# Patient Record
Sex: Male | Born: 1980 | Race: White | Hispanic: No | Smoking: Current every day smoker
Health system: Southern US, Community
[De-identification: ages and names within clinical notes are randomized; demographics above are authoritative.]

---

## 2013-10-29 ENCOUNTER — Emergency Department (HOSPITAL_COMMUNITY)
Admission: EM | Admit: 2013-10-29 | Discharge: 2013-10-29 | Disposition: A | Discharge status: Home / Self Care | Payer: Self-pay | Attending: Emergency Medicine | Admitting: Emergency Medicine

## 2013-10-29 ENCOUNTER — Encounter (HOSPITAL_COMMUNITY): Payer: Self-pay | Admitting: Emergency Medicine

## 2013-10-29 DIAGNOSIS — F17200 Nicotine dependence, unspecified, uncomplicated: Secondary | ICD-10-CM | POA: Insufficient documentation

## 2013-10-29 DIAGNOSIS — X500XXA Overexertion from strenuous movement or load, initial encounter: Secondary | ICD-10-CM | POA: Insufficient documentation

## 2013-10-29 DIAGNOSIS — Y9389 Activity, other specified: Secondary | ICD-10-CM | POA: Insufficient documentation

## 2013-10-29 DIAGNOSIS — S335XXA Sprain of ligaments of lumbar spine, initial encounter: Secondary | ICD-10-CM | POA: Insufficient documentation

## 2013-10-29 DIAGNOSIS — Y929 Unspecified place or not applicable: Secondary | ICD-10-CM | POA: Insufficient documentation

## 2013-10-29 DIAGNOSIS — S39012A Strain of muscle, fascia and tendon of lower back, initial encounter: Secondary | ICD-10-CM

## 2013-10-29 MED ORDER — CYCLOBENZAPRINE HCL 10 MG PO TABS
10.0000 mg | ORAL_TABLET | Freq: Once | ORAL | Status: AC
  Administered 2013-10-29: 10 mg via ORAL
  Filled 2013-10-29: qty 1

## 2013-10-29 MED ORDER — CYCLOBENZAPRINE HCL 10 MG PO TABS: 10.0000 mg | ORAL_TABLET | Freq: Three times a day (TID) | ORAL | Status: DC | PRN

## 2013-10-29 MED ORDER — OXYCODONE-ACETAMINOPHEN 5-325 MG PO TABS: 1.0000 | ORAL_TABLET | ORAL | Status: DC | PRN

## 2013-10-29 MED ORDER — IBUPROFEN 800 MG PO TABS: 800.0000 mg | ORAL_TABLET | Freq: Three times a day (TID) | ORAL | Status: DC

## 2013-10-29 MED ORDER — OXYCODONE-ACETAMINOPHEN 5-325 MG PO TABS
2.0000 | ORAL_TABLET | Freq: Once | ORAL | Status: AC
  Administered 2013-10-29: 2 via ORAL
  Filled 2013-10-29: qty 2

## 2013-10-29 NOTE — ED Provider Notes (Signed)
CSN: [id number]     Arrival date & time 10/29/13  1926 History   First MD Initiated Contact with Patient 10/29/13 2102     Chief Complaint  Patient presents with  . Back Pain     (Consider location/radiation/quality/duration/timing/severity/associated sxs/prior Treatment) Patient is a 33 y.o. male presenting with back pain. The history is provided by the patient.  Back Pain Location:  Lumbar spine and thoracic spine Quality:  Aching and burning Pain severity:  Moderate Pain is:  Same all the time Onset quality:  Gradual Duration:  3 days Timing:  Constant Progression:  Unchanged Chronicity:  New Context: lifting heavy objects and twisting   Context: not falling   Context comment:  Pt reports pain after moving furnitiure Relieved by:  Nothing Worsened by:  Bending, twisting and movement Ineffective treatments:  OTC medications and NSAIDs Associated symptoms: no abdominal pain, no abdominal swelling, no bladder incontinence, no bowel incontinence, no chest pain, no dysuria, no fever, no headaches, no leg pain, no numbness, no paresthesias, no pelvic pain, no perianal numbness, no tingling and no weakness     History reviewed. No pertinent past medical history. History reviewed. No pertinent past surgical history. History reviewed. No pertinent family history. History  Substance Use Topics  . Smoking status: Current Every Day Smoker  . Smokeless tobacco: Not on file  . Alcohol Use: Yes    Review of Systems  Constitutional: Negative for fever.  Respiratory: Negative for shortness of breath.   Cardiovascular: Negative for chest pain.  Gastrointestinal: Negative for vomiting, abdominal pain, constipation and bowel incontinence.  Genitourinary: Negative for bladder incontinence, dysuria, hematuria, flank pain, decreased urine volume, difficulty urinating and pelvic pain.       No perineal numbness or incontinence of urine or feces  Musculoskeletal: Positive for back pain.  Negative for joint swelling.  Skin: Negative for rash.  Neurological: Negative for tingling, weakness, numbness, headaches and paresthesias.  All other systems reviewed and are negative.     Allergies  Review of patient's allergies indicates no known allergies.  Home Medications   Prior to Admission medications   Not on File   BP 150/77  Pulse 82  Temp(Src) 98.1 F (36.7 C) (Oral)  Resp 20  Ht 5\' 11"  (1.803 m)  Wt 200 lb (90.719 kg)  BMI 27.91 kg/m2  SpO2 99% Physical Exam  Nursing note and vitals reviewed. Constitutional: He is oriented to person, place, and time. He appears well-developed and well-nourished. No distress.  HENT:  Head: Normocephalic and atraumatic.  Neck: Normal range of motion. Neck supple.  Cardiovascular: Normal rate, regular rhythm, normal heart sounds and intact distal pulses.   No murmur heard. Pulmonary/Chest: Effort normal and breath sounds normal. No respiratory distress.  Abdominal: Soft. He exhibits no distension. There is no tenderness.  Musculoskeletal: He exhibits tenderness. He exhibits no edema.       Lumbar back: He exhibits tenderness and pain. He exhibits normal range of motion, no swelling, no deformity, no laceration and normal pulse.       Arms: Localized ttp of the right thoracic and lumbar paraspinal muscles.  No spinal tenderness.  DP pulses are brisk and symmetrical.  Distal sensation intact.  Hip Flexors/Extensors are intact.  Pt has 5/5 strength against resistance of bilateral lower extremities.     Neurological: He is alert and oriented to person, place, and time. He has normal strength. No sensory deficit. He exhibits normal muscle tone. Coordination and gait normal.  Reflex Scores:  Patellar reflexes are 2+ on the right side and 2+ on the left side.      Achilles reflexes are 2+ on the right side and 2+ on the left side. Skin: Skin is warm and dry. No rash noted.    ED Course  Procedures (including critical care  time) Labs Review Labs Reviewed - No data to display  Imaging Review No results found.   EKG Interpretation None      MDM   Final diagnoses:  Lumbar strain   Pt has localized ttp of the paraspinal muscles on the right.  No indication for imaging at this time.  No focal neuro deficits, ambulates with steady gait.  No concerning neurological sx's.  Likely muscular strain.  Pt agrees to symptomatic tx with flexeril, ibuprofen and percocet.  Agrees to PMD f/u.  Pt is well appearing , VSS and stable for d/c     Janille Draughon L. Trisha Mangleriplett, PA-C 10/31/13 2208

## 2013-10-29 NOTE — Discharge Instructions (Signed)
Muscle Strain A muscle strain is an injury that occurs when a muscle is stretched beyond its normal length. Usually a small number of muscle fibers are torn when this happens. Muscle strain is rated in degrees. First-degree strains have the least amount of muscle fiber tearing and pain. Second-degree and third-degree strains have increasingly more tearing and pain.  Usually, recovery from muscle strain takes 1 2 weeks. Complete healing takes 5 6 weeks.  CAUSES  Muscle strain happens when a sudden, violent force placed on a muscle stretches it too far. This may occur with lifting, sports, or a fall.  RISK FACTORS Muscle strain is especially common in athletes.  SIGNS AND SYMPTOMS At the site of the muscle strain, there may be:  Pain.  Bruising.  Swelling.  Difficulty using the muscle due to pain or lack of normal function. DIAGNOSIS  Your health care provider will perform a physical exam and ask about your medical history. TREATMENT  Often, the best treatment for a muscle strain is resting, icing, and applying cold compresses to the injured area.  HOME CARE INSTRUCTIONS   Use the PRICE method of treatment to promote muscle healing during the first 2 3 days after your injury. The PRICE method involves:  Protecting the muscle from being injured again.  Restricting your activity and resting the injured body part.  Icing your injury. To do this, put ice in a plastic bag. Place a towel between your skin and the bag. Then, apply the ice and leave it on from 15 20 minutes each hour. After the third day, switch to moist heat packs.  Apply compression to the injured area with a splint or elastic bandage. Be careful not to wrap it too tightly. This may interfere with blood circulation or increase swelling.  Elevate the injured body part above the level of your heart as often as you can.  Only take over-the-counter or prescription medicines for pain, discomfort, or fever as directed by your  health care provider.  Warming up prior to exercise helps to prevent future muscle strains. SEEK MEDICAL CARE IF:   You have increasing pain or swelling in the injured area.  You have numbness, tingling, or a significant loss of strength in the injured area. MAKE SURE YOU:   Understand these instructions.  Will watch your condition.  Will get help right away if you are not doing well or get worse. Document Released: 05/10/2005 Document Revised: 02/28/2013 Document Reviewed: 12/07/2012 [facility] Patient Information 2014 [place], [place].  Lumbosacral Strain Lumbosacral strain is a strain of any of the parts that make up your lumbosacral vertebrae. Your lumbosacral vertebrae are the bones that make up the lower third of your backbone. Your lumbosacral vertebrae are held together by muscles and tough, fibrous tissue (ligaments).  CAUSES  A sudden blow to your back can cause lumbosacral strain. Also, anything that causes an excessive stretch of the muscles in the low back can cause this strain. This is typically seen when people exert themselves strenuously, fall, lift heavy objects, bend, or crouch repeatedly. RISK FACTORS  Physically demanding work.  Participation in pushing or pulling sports or sports that require sudden twist of the back (tennis, golf, baseball).  Weight lifting.  Excessive lower back curvature.  Forward-tilted pelvis.  Weak back or abdominal muscles or both.  Tight hamstrings. SIGNS AND SYMPTOMS  Lumbosacral strain may cause pain in the area of your injury or pain that moves (radiates) down your leg.  DIAGNOSIS Your health care provider  can often diagnose lumbosacral strain through a physical exam. In some cases, you may need tests such as X-ray exams.  TREATMENT  Treatment for your lower back injury depends on many factors that your clinician will have to evaluate. However, most treatment will include the use of anti-inflammatory medicines. HOME CARE  INSTRUCTIONS   Avoid hard physical activities (tennis, racquetball, waterskiing) if you are not in proper physical condition for it. This may aggravate or create problems.  If you have a back problem, avoid sports requiring sudden body movements. Swimming and walking are generally safer activities.  Maintain good posture.  Maintain a healthy weight.  For acute conditions, you may put ice on the injured area.  Put ice in a plastic bag.  Place a towel between your skin and the bag.  Leave the ice on for 20 minutes, 2 3 times a day.  When the low back starts healing, stretching and strengthening exercises may be recommended. SEEK MEDICAL CARE IF:  Your back pain is getting worse.  You experience severe back pain not relieved with medicines. SEEK IMMEDIATE MEDICAL CARE IF:   You have numbness, tingling, weakness, or problems with the use of your arms or legs.  There is a change in bowel or bladder control.  You have increasing pain in any area of the body, including your belly (abdomen).  You notice shortness of breath, dizziness, or feel faint.  You feel sick to your stomach (nauseous), are throwing up (vomiting), or become sweaty.  You notice discoloration of your toes or legs, or your feet get very cold. MAKE SURE YOU:   Understand these instructions.  Will watch your condition.  Will get help right away if you are not doing well or get worse. Document Released: 02/17/2005 Document Revised: 02/28/2013 Document Reviewed: 12/27/2012 [facility] Patient Information 2014 [place], [place].

## 2013-10-29 NOTE — ED Notes (Signed)
Pt was helping someone move furniture when he "felt a pop in his back". This occurred 3 days ago and pt has been at home using ice packs and trying to medicate self with Tylenol and Motrin but has not experienced any relief from pain. Pt states his back hurts "so bad that it is hard to take a breath".

## 2013-10-29 NOTE — ED Notes (Signed)
Low back for  3 days

## 2013-11-02 NOTE — ED Provider Notes (Signed)
Medical screening examination/treatment/procedure(s) were performed by non-physician practitioner and as supervising physician I was immediately available for consultation/collaboration.   EKG Interpretation None       Kaitland Lewellyn, MD 11/02/13 1944 

## 2013-11-25 ENCOUNTER — Emergency Department (HOSPITAL_COMMUNITY)
Admission: EM | Admit: 2013-11-25 | Discharge: 2013-11-25 | Disposition: A | Discharge status: Home / Self Care | Payer: Self-pay | Attending: Emergency Medicine | Admitting: Emergency Medicine

## 2013-11-25 ENCOUNTER — Emergency Department (HOSPITAL_COMMUNITY): Payer: Self-pay

## 2013-11-25 ENCOUNTER — Encounter (HOSPITAL_COMMUNITY): Payer: Self-pay | Admitting: Emergency Medicine

## 2013-11-25 DIAGNOSIS — M542 Cervicalgia: Secondary | ICD-10-CM | POA: Insufficient documentation

## 2013-11-25 DIAGNOSIS — J40 Bronchitis, not specified as acute or chronic: Secondary | ICD-10-CM

## 2013-11-25 DIAGNOSIS — Z79899 Other long term (current) drug therapy: Secondary | ICD-10-CM | POA: Insufficient documentation

## 2013-11-25 DIAGNOSIS — J019 Acute sinusitis, unspecified: Secondary | ICD-10-CM

## 2013-11-25 DIAGNOSIS — R509 Fever, unspecified: Secondary | ICD-10-CM | POA: Insufficient documentation

## 2013-11-25 DIAGNOSIS — J209 Acute bronchitis, unspecified: Secondary | ICD-10-CM | POA: Insufficient documentation

## 2013-11-25 DIAGNOSIS — H109 Unspecified conjunctivitis: Secondary | ICD-10-CM | POA: Insufficient documentation

## 2013-11-25 DIAGNOSIS — J029 Acute pharyngitis, unspecified: Secondary | ICD-10-CM | POA: Insufficient documentation

## 2013-11-25 DIAGNOSIS — F17200 Nicotine dependence, unspecified, uncomplicated: Secondary | ICD-10-CM | POA: Insufficient documentation

## 2013-11-25 DIAGNOSIS — J329 Chronic sinusitis, unspecified: Secondary | ICD-10-CM | POA: Insufficient documentation

## 2013-11-25 DIAGNOSIS — R6883 Chills (without fever): Secondary | ICD-10-CM | POA: Insufficient documentation

## 2013-11-25 MED ORDER — PROMETHAZINE-CODEINE 6.25-10 MG/5ML PO SYRP: 5.0000 mL | ORAL_SOLUTION | Freq: Four times a day (QID) | ORAL | Status: AC | PRN

## 2013-11-25 MED ORDER — CIPROFLOXACIN HCL 500 MG PO TABS: 500.0000 mg | ORAL_TABLET | Freq: Two times a day (BID) | ORAL | Status: AC

## 2013-11-25 MED ORDER — TOBRAMYCIN 0.3 % OP SOLN
2.0000 [drp] | Freq: Once | OPHTHALMIC | Status: AC
  Administered 2013-11-25: 2 [drp] via OPHTHALMIC
  Filled 2013-11-25: qty 5

## 2013-11-25 MED ORDER — HYDROCOD POLST-CHLORPHEN POLST 10-8 MG/5ML PO LQCR
5.0000 mL | Freq: Once | ORAL | Status: AC
  Administered 2013-11-25: 5 mL via ORAL
  Filled 2013-11-25: qty 5

## 2013-11-25 MED ORDER — CIPROFLOXACIN HCL 250 MG PO TABS
500.0000 mg | ORAL_TABLET | Freq: Once | ORAL | Status: AC
  Administered 2013-11-25: 500 mg via ORAL
  Filled 2013-11-25: qty 2

## 2013-11-25 NOTE — ED Provider Notes (Signed)
CSN: [id number]     Arrival date & time 11/25/13  1646 History   None    Chief Complaint  Patient presents with  . Cough   Patient is a 33 y.o. male presenting with cough. The history is provided by the patient. No language interpreter was used.  Cough Cough characteristics: Productive at times. Pt reports 2 occasions of blood tinged mucus. Severity:  Moderate Onset quality:  Gradual Duration:  2 weeks Timing:  Intermittent Progression:  Worsening Chronicity:  New Smoker: yes   Relieved by:  Nothing Worsened by:  Nothing tried Associated symptoms: chills, fever and sore throat   Associated symptoms: no chest pain, no rash, no rhinorrhea and no weight loss    This chart was scribed for non-physician practitioner working with Vanetta MuldersScott Zackowski, MD, by Andrew Auaven Small, ED Scribe. This patient was seen in room APFT21/APFT21 and the patient's care was started at 5:36 PM.  Shawnie DapperJoshua M Southall is a 33 y.o. male who presents to the Emergency Department complaining of a gradual worsening cough. States symptoms began with throat irritation and a dry cough about 1.5 weeks ago.  Pt has also had an increased temperature,  right sided facial pain, right sided neck and a right erythematous eye. Pt has tried abx eye drops and rinsing eye with baby shampoo without relief to redness. Pt significant other has recently had pink eye. Pt has been taking ibuprofen and tylenol interchangabley.    History reviewed. No pertinent past medical history. History reviewed. No pertinent past surgical history. No family history on file. History  Substance Use Topics  . Smoking status: Current Every Day Smoker  . Smokeless tobacco: Not on file  . Alcohol Use: Yes    Review of Systems  Constitutional: Positive for fever and chills. Negative for weight loss.  HENT: Positive for sore throat. Negative for rhinorrhea.   Eyes: Positive for redness.  Respiratory: Positive for cough.   Cardiovascular: Negative for chest pain.   Musculoskeletal: Positive for neck pain.  Skin: Negative for rash.  All other systems reviewed and are negative.  Allergies  Review of patient's allergies indicates no known allergies.  Home Medications   Prior to Admission medications   Medication Sig Start Date End Date Taking? Authorizing Provider  cyclobenzaprine (FLEXERIL) 10 MG tablet Take 1 tablet (10 mg total) by mouth 3 (three) times daily as needed. 10/29/13   Tammy L. Triplett, PA-C  ibuprofen (ADVIL,MOTRIN) 800 MG tablet Take 1 tablet (800 mg total) by mouth 3 (three) times daily. 10/29/13   Tammy L. Triplett, PA-C  oxyCODONE-acetaminophen (PERCOCET/ROXICET) 5-325 MG per tablet Take 1 tablet by mouth every 4 (four) hours as needed for severe pain. 10/29/13   Tammy L. Triplett, PA-C   BP 123/75  Pulse 90  Temp(Src) 99 F (37.2 C) (Oral)  Resp 20  SpO2 96% Physical Exam  Nursing note and vitals reviewed. Constitutional: He is oriented to person, place, and time. He appears well-developed and well-nourished. No distress.  HENT:  Head: Normocephalic and atraumatic.  Right Ear: Hearing, tympanic membrane, external ear and ear canal normal.  Left Ear: Hearing, tympanic membrane, external ear and ear canal normal.  Mouth/Throat: Uvula is midline. Posterior oropharyngeal erythema present. No tonsillar abscesses.  Airway patent  Nasal congestion.  Eyes: Conjunctivae and EOM are normal.  Right eye increased redness of conjunctiva  and vulvar conjunctiva . Anterior chamber clear bilaterally   Neck: Neck supple.  Cardiovascular: Normal rate, regular rhythm and normal heart sounds.  Exam reveals no gallop and no friction rub.   No murmur heard. Pulmonary/Chest: Effort normal.  coarse breath sounds with a few scattered rhonchi  Musculoskeletal: Normal range of motion. He exhibits no edema.  Cap refill < 2 sec  Neurological: He is alert and oriented to person, place, and time.  Skin: Skin is warm and dry.  Psychiatric: He has a  normal mood and affect. His behavior is normal.    ED Course  Procedures DIAGNOSTIC STUDIES: Oxygen Saturation is 96% on RA, normal by my interpretation.    COORDINATION OF CARE: 5:43 PM-Discussed treatment plan which includes CXR with pt at bedside and pt agreed to plan.   Labs Review Labs Reviewed - No data to display  Imaging Review No results found.   EKG Interpretation None      MDM Chest x-ray is negative for any acute event. Temperature is 90.9 otherwise vital signs are within normal limits. Pulse oximetry is 96% on room air. Within normal limits by my interpretation. The patient speaks in complete sentences without problem.  Suspect a bronchitis and sinusitis. Patient will be treated with Cipro twice a day, Claritin-D, tobramycin ophthalmic drops for conjunctivitis. Promethazine codeine cough medication also given.    Final diagnoses:  None    I personally performed the services described in this documentation, which was scribed in my presence. The recorded information has been reviewed and is accurate.      Kathie DikeHobson M Iolani Twilley, PA-C 11/25/13 [phone]

## 2013-11-25 NOTE — Discharge Instructions (Signed)
And your x-ray is negative for pneumonia or any acute changes. Your examination is consistent with a sinusitis and bronchitis. Please use Cipro 2 times daily, use promethazine codeine cough medication every 6 hours. Please use Claritin-D every 12 hours for congestion. Your examination is also consistent with conjunctivitis (pink eye). Please  apply cool compresses to the eye several times daily. This is extremely contagious. Please wash hands and surfaces frequently. Please keep your distance from others. Please use your tobramycin eyedrops every 4 hours for the next 5 days. Promethazine codeine cough medication may cause drowsiness, please use with caution.  Conjunctivitis Conjunctivitis is commonly called "pink eye." Conjunctivitis can be caused by bacterial or viral infection, allergies, or injuries. There is usually redness of the lining of the eye, itching, discomfort, and sometimes discharge. There may be deposits of matter along the eyelids. A viral infection usually causes a watery discharge, while a bacterial infection causes a yellowish, thick discharge. Pink eye is very contagious and spreads by direct contact. You may be given antibiotic eyedrops as part of your treatment. Before using your eye medicine, remove all drainage from the eye by washing gently with warm water and cotton balls. Continue to use the medication until you have awakened 2 mornings in a row without discharge from the eye. Do not rub your eye. This increases the irritation and helps spread infection. Use separate towels from other household members. Wash your hands with soap and water before and after touching your eyes. Use cold compresses to reduce pain and sunglasses to relieve irritation from light. Do not wear contact lenses or wear eye makeup until the infection is gone. SEEK MEDICAL CARE IF:   Your symptoms are not better after 3 days of treatment.  You have increased pain or trouble seeing.  The outer eyelids become  very red or swollen. Document Released: 06/17/2004 Document Revised: 08/02/2011 Document Reviewed: 05/10/2005 [facility] Patient Information 2015 [place], [place]. This information is not intended to replace advice given to you by your health care provider. Make sure you discuss any questions you have with your health care provider.  Bronchitis Bronchitis is swelling (inflammation) of the air tubes leading to your lungs (bronchi). This causes mucus and a cough. If the swelling gets bad, you may have trouble breathing. HOME CARE   Rest.  Drink enough fluids to keep your pee (urine) clear or pale yellow (unless you have a condition where you have to watch how much you drink).  Only take medicine as told by your doctor. If you were given antibiotic medicines, finish them even if you start to feel better.  Avoid smoke, irritating chemicals, and strong smells. These make the problem worse. Quit smoking if you smoke. This helps your lungs heal faster.  Use a cool mist humidifier. Change the water in the humidifier every day. You can also sit in the bathroom with hot shower running for 5-10 minutes. Keep the door closed.  See your health care provider as told.  Wash your hands often. GET HELP IF: Your problems do not get better after 1 week. GET HELP RIGHT AWAY IF:   Your fever gets worse.  You have chills.  Your chest hurts.  Your problems breathing get worse.  You have blood in your mucus.  You pass out (faint).  You feel light-headed.  You have a bad headache.  You throw up (vomit) again and again. MAKE SURE YOU:  Understand these instructions.  Will watch your condition.  Will get help  right away if you are not doing well or get worse. Document Released: 10/27/2007 Document Revised: 05/15/2013 Document Reviewed: 01/02/2013 [facility] Patient Information 2015 [facility] [facility] [place], [place]. This information is not intended to replace advice given to you by your health care provider. Make  sure you discuss any questions you have with your health care provider.

## 2013-11-25 NOTE — ED Notes (Signed)
Pt c/o throat irritation that started a week and half ago, started feeling worse a few days, cough, redness to right eye, chills, generalized weakness,

## 2013-11-25 NOTE — ED Provider Notes (Signed)
Medical screening examination/treatment/procedure(s) were performed by non-physician practitioner and as supervising physician I was immediately available for consultation/collaboration.   EKG Interpretation None        Vanetta MuldersScott Abbegale Stehle, MD 11/25/13 Rickey [id number]

## 2014-12-17 IMAGING — CR DG CHEST 2V
2 series · 2 of 2 positions shown · non-contrast
Comparison: None.

CLINICAL DATA: Cough.  Hemoptysis.  Fever.  Chest congestion.

EXAM:
CHEST  2 VIEW

[view not recorded (1 of 2)]
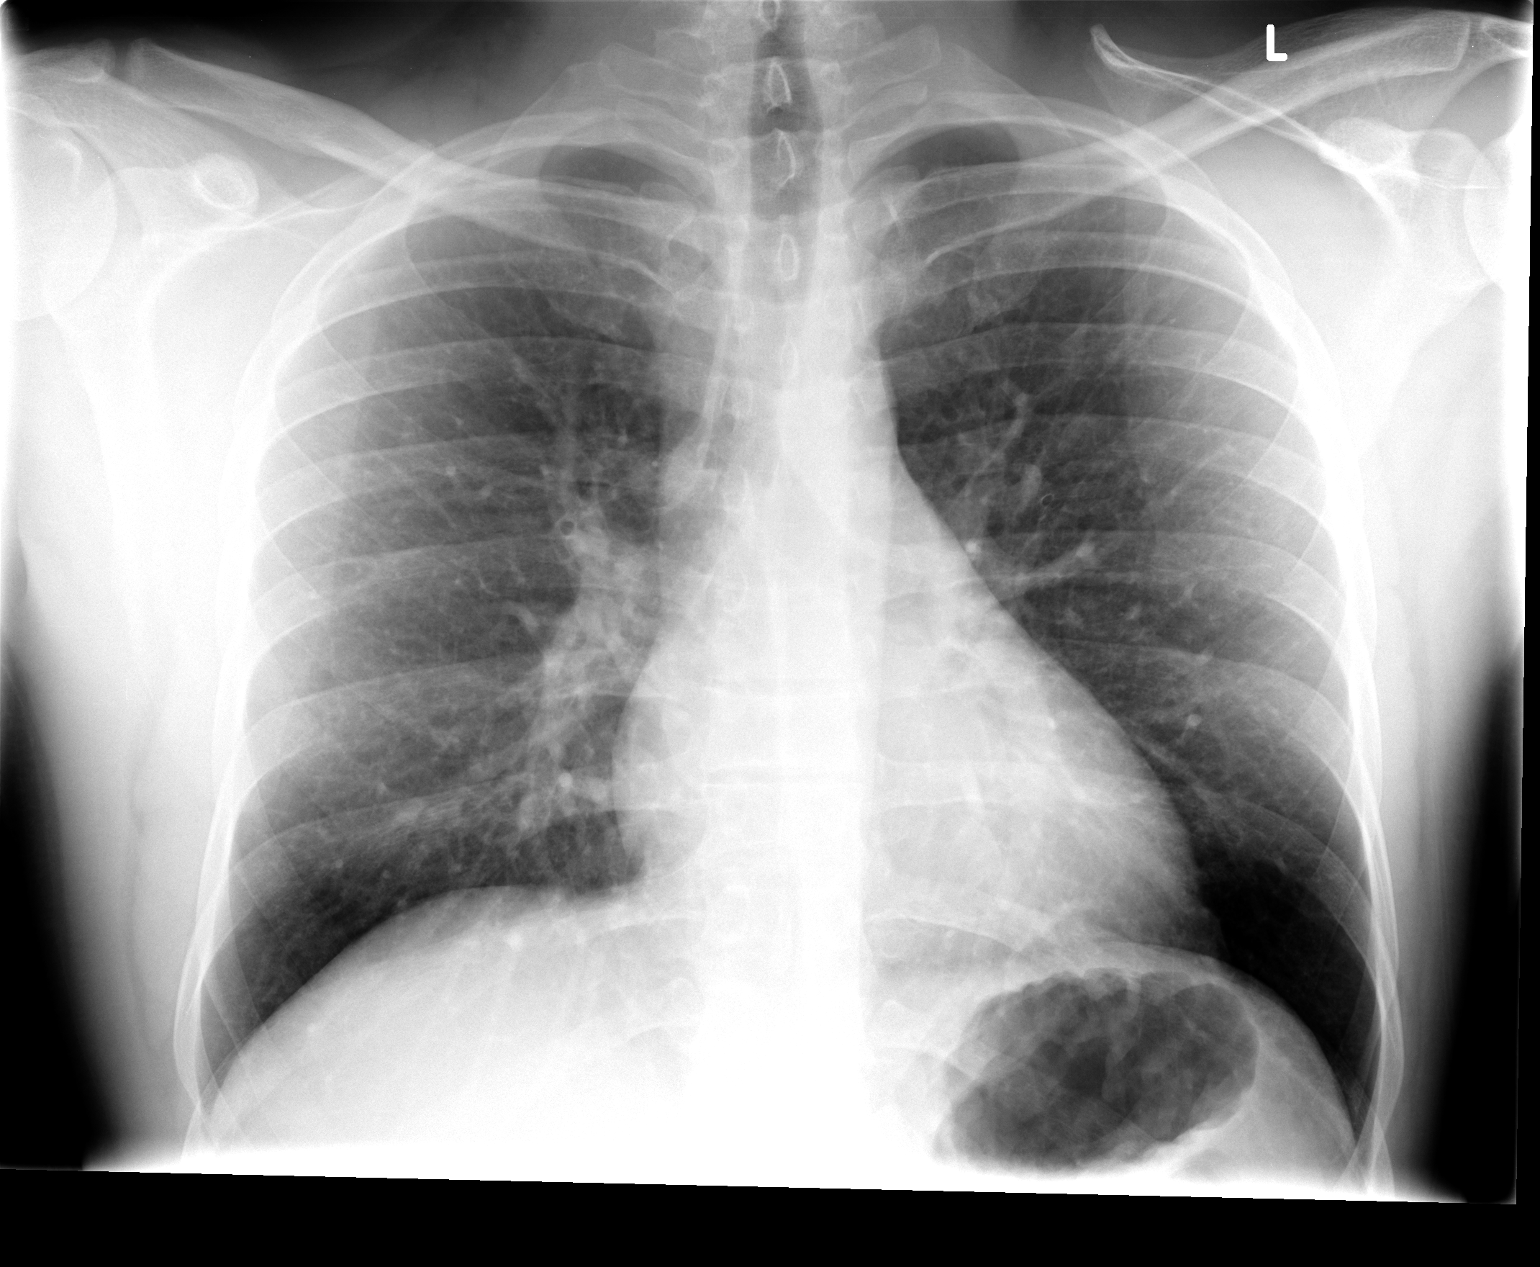

[view not recorded (2 of 2)]
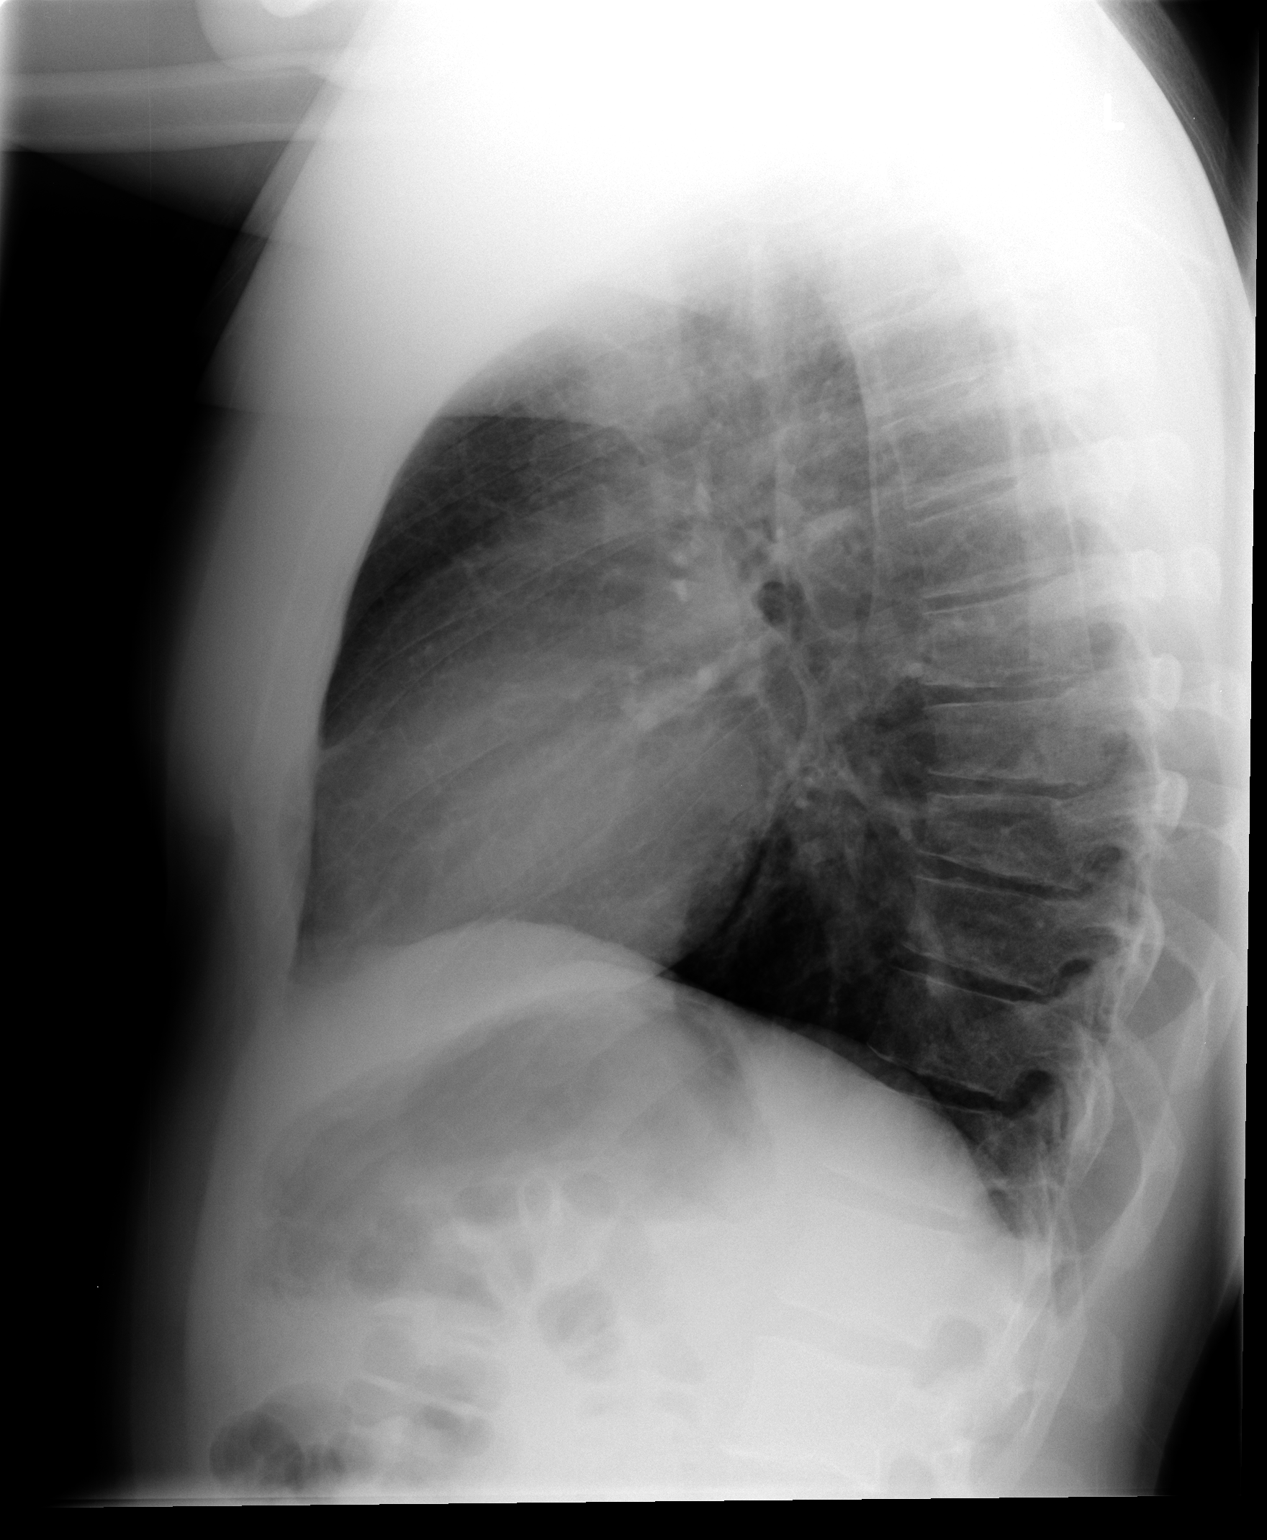

[2 of 2 positions shown; findings below may reference images not displayed]

FINDINGS: The heart size and mediastinal contours are within normal limits.
Both lungs are clear. The visualized skeletal structures are
unremarkable.
IMPRESSION: No active cardiopulmonary disease.
